# Patient Record
Sex: Male | Born: 1990 | Race: White | Hispanic: No | Marital: Single | Smoking: Never smoker
Health system: Southern US, Community
[De-identification: ages and names within clinical notes are randomized; demographics above are authoritative.]

---

## 2014-12-14 ENCOUNTER — Emergency Department (HOSPITAL_COMMUNITY)
Admission: EM | Admit: 2014-12-14 | Discharge: 2014-12-14 | Disposition: A | Discharge status: Home / Self Care | Payer: Worker's Compensation | Attending: Emergency Medicine | Admitting: Emergency Medicine

## 2014-12-14 ENCOUNTER — Emergency Department (HOSPITAL_COMMUNITY): Payer: Worker's Compensation

## 2014-12-14 ENCOUNTER — Encounter (HOSPITAL_COMMUNITY): Payer: Self-pay | Admitting: Emergency Medicine

## 2014-12-14 DIAGNOSIS — W230XXA Caught, crushed, jammed, or pinched between moving objects, initial encounter: Secondary | ICD-10-CM | POA: Diagnosis not present

## 2014-12-14 DIAGNOSIS — Y990 Civilian activity done for income or pay: Secondary | ICD-10-CM | POA: Diagnosis not present

## 2014-12-14 DIAGNOSIS — S62609B Fracture of unspecified phalanx of unspecified finger, initial encounter for open fracture: Secondary | ICD-10-CM

## 2014-12-14 DIAGNOSIS — S60132A Contusion of left middle finger with damage to nail, initial encounter: Secondary | ICD-10-CM | POA: Diagnosis not present

## 2014-12-14 DIAGNOSIS — S62623B Displaced fracture of medial phalanx of left middle finger, initial encounter for open fracture: Secondary | ICD-10-CM | POA: Insufficient documentation

## 2014-12-14 DIAGNOSIS — S6992XA Unspecified injury of left wrist, hand and finger(s), initial encounter: Secondary | ICD-10-CM | POA: Diagnosis present

## 2014-12-14 DIAGNOSIS — Y9389 Activity, other specified: Secondary | ICD-10-CM | POA: Insufficient documentation

## 2014-12-14 DIAGNOSIS — Y9289 Other specified places as the place of occurrence of the external cause: Secondary | ICD-10-CM | POA: Diagnosis not present

## 2014-12-14 MED ORDER — CEPHALEXIN 500 MG PO CAPS: 500.0000 mg | ORAL_CAPSULE | Freq: Four times a day (QID) | ORAL | Status: DC

## 2014-12-14 MED ORDER — HYDROCODONE-ACETAMINOPHEN 5-325 MG PO TABS: 1.0000 | ORAL_TABLET | Freq: Four times a day (QID) | ORAL | Status: DC | PRN

## 2014-12-14 MED ORDER — CEFAZOLIN SODIUM 1 G IJ SOLR
1.0000 g | Freq: Once | INTRAMUSCULAR | Status: AC
  Administered 2014-12-14: 1 g via INTRAMUSCULAR
  Filled 2014-12-14: qty 10

## 2014-12-14 MED ORDER — BUPIVACAINE HCL (PF) 0.5 % IJ SOLN
20.0000 mL | Freq: Once | INTRAMUSCULAR | Status: AC
  Administered 2014-12-14: 20 mL
  Filled 2014-12-14: qty 20

## 2014-12-14 MED ORDER — LIDOCAINE HCL (PF) 1 % IJ SOLN
5.0000 mL | Freq: Once | INTRAMUSCULAR | Status: AC
  Administered 2014-12-14: 5 mL
  Filled 2014-12-14: qty 5

## 2014-12-14 NOTE — ED Provider Notes (Signed)
CSN: [id number]     Arrival date & time 12/14/14  4098 History  This chart was scribed for non-physician practitioner, Junius Finner, PA-C working with Arby Barrette, MD by Greggory Stallion, ED scribe. This patient was seen in room TR08C/TR08C and the patient's care was started at 10:05 AM.    Chief Complaint  Patient presents with  . Finger Injury   The history is provided by the patient. No language interpreter was used.    HPI Comments: Alan Haynes is a 24 y.o. male who presents to the Emergency Department complaining of left middle finger injury that occurred around 6:45 AM today. Pt was trying to clean an industrial machine when his left finger got crushed in it. He reports sudden onset pain that he rates 4/10 with associated swelling and hematoma. Bending his finger worsens pain. His tetanus was updated today at his employee occupational health after injury. Pt is right hand dominant.   History reviewed. No pertinent past medical history. History reviewed. No pertinent past surgical history. No family history on file. History  Substance Use Topics  . Smoking status: Never Smoker   . Smokeless tobacco: Not on file  . Alcohol Use: Yes    Review of Systems  Musculoskeletal: Positive for joint swelling and arthralgias.  Skin: Positive for wound.  All other systems reviewed and are negative.  Allergies  Review of patient's allergies indicates no known allergies.  Home Medications   Prior to Admission medications   Medication Sig Start Date End Date Taking? Authorizing Provider  cephALEXin (KEFLEX) 500 MG capsule Take 1 capsule (500 mg total) by mouth 4 (four) times daily. 12/14/14   Junius Finner, PA-C  HYDROcodone-acetaminophen (NORCO/VICODIN) 5-325 MG per tablet Take 1-2 tablets by mouth every 6 (six) hours as needed for moderate pain or severe pain. 12/14/14   Junius Finner, PA-C   BP 119/69 mmHg  Pulse 72  Temp(Src) 98.2 F (36.8 C) (Oral)  Resp 16  Ht  (1.626 m)   Wt 165 lb (74.844 kg)  BMI 28.31 kg/m2  SpO2 98%   Physical Exam  Constitutional: He is oriented to person, place, and time. He appears well-developed and well-nourished.  HENT:  Head: Normocephalic and atraumatic.  Eyes: EOM are normal.  Neck: Normal range of motion.  Cardiovascular: Normal rate.   Cap refill less than 3 seconds.   Pulmonary/Chest: Effort normal.  Musculoskeletal: Normal range of motion.  Distal aspect of left middle finger with hematoma of skin and radial aspect of nailbed. Mild tenderness to DIP. Full ROM.   Neurological: He is alert and oriented to person, place, and time.  Sensation intact.  Skin: Skin is warm and dry.  Psychiatric: He has a normal mood and affect. His behavior is normal.  Nursing note and vitals reviewed.   ED Course  Procedures (including critical care time)  DIAGNOSTIC STUDIES: Oxygen Saturation is 98% on RA, normal by my interpretation.    COORDINATION OF CARE: 10:07 AM-Discussed treatment plan which includes xray with pt at bedside and pt agreed to plan.   10:54 AM-Brian Wynona Neat, PA-C saw and evaluated pt. He will removal nail and start on ancef in ED.   Labs Review Labs Reviewed - No data to display  Imaging Review Dg Finger Middle Left  12/14/2014   CLINICAL DATA:  Crush injury of the tip of the left middle finger this morning.  EXAM: LEFT MIDDLE FINGER 2+V  COMPARISON:  None.  FINDINGS: There is a comminuted fracture of the  tuft of the distal phalanx of the left middle finger. No angulation or displacement. The other bones of the finger are normal.  IMPRESSION: Slightly comminuted fracture of a portion of the tuft of the distal phalanx.   Electronically Signed   By: Francene BoyersJames  Maxwell M.D.   On: 12/14/2014 10:19     EKG Interpretation None      MDM   Final diagnoses:  Finger fracture, left, open, initial encounter    Pt is a 23yo male with crush injury to left middle finger, distal aspect, neurovascularly in tact. Plain  films: significant for slightly comminuted fracture of portion of tuft of distal phalanx.  Karie ChimeraBrian Buchanan, PA-C, hand surgery, evaluated and tx pt in ED. Pt given ancef 1g in ED. Discharged home with keflex and pain medication. Advised to f/u in Dr. Carlos LeveringGramig's office in 1 week for wound and fracture recheck. Return precautions provided. Pt verbalized understanding and agreement with tx plan.   I personally performed the services described in this documentation, which was scribed in my presence. The recorded information has been reviewed and is accurate.   Junius Finnerrin O'Malley, PA-C 12/14/14 1641  Arby BarretteMarcy Pfeiffer, MD 12/16/14 2006

## 2014-12-14 NOTE — Consult Note (Signed)
Reason for Consult: Left middle finger crush injury Referring Physician: Dr. Thalia Bloodgood Hinzman is an 24 y.o. male.  HPI: The patient is a pleasant 24 year old male, right-hand-dominant, who presented to the emergency room setting today for evaluation of his left middle finger. He is employed at [employer] and sustained an on-the-job injury. The patient was cleaning a packing machine, when the left middle finger became caught in the machine. He sustained a crush injury to the distal tip of the left middle finger. He was initially seen and evaluated by the emergency room staff. He was found to have a fracture about the left middle finger distal phalanx, this was extra-articular, and nondisplaced. He was noted to have at least 30% subungual hematoma present about the left middle finger. We were consulted in regards to the finger given the concerns for an open fracture presentation. Upon examination the patient was pleasant and in no acute distress. He had obvious swelling, ecchymosis and an evolving subungual hematoma of at least 30% of the well plate. Remaining hand was without injury. He denied any other injury to the upper extremity. Radiographs were reviewed and showed a nondisplaced fracture about the distal phalanx, extra-articular in nature.  History reviewed. No pertinent past medical history.  History reviewed. No pertinent past surgical history.  No family history on file.  Social History:  reports that he has never smoked. He does not have any smokeless tobacco history on file. He reports that he drinks alcohol. He reports that he does not use illicit drugs.  Allergies: No Known Allergies  Medications: None  No results found for this or any previous visit (from the past 48 hour(s)).  Dg Finger Middle Left  12/14/2014   CLINICAL DATA:  Crush injury of the tip of the left middle finger this morning.  EXAM: LEFT MIDDLE FINGER 2+V  COMPARISON:  None.  FINDINGS: There is a  comminuted fracture of the tuft of the distal phalanx of the left middle finger. No angulation or displacement. The other bones of the finger are normal.  IMPRESSION: Slightly comminuted fracture of a portion of the tuft of the distal phalanx.   Electronically Signed   By: Francene Boyers M.D.   On: 12/14/2014 10:19    Review of Systems  Constitutional: Negative.   HENT: Negative.   Eyes: Negative.   Respiratory: Negative.   Cardiovascular: Negative.   Gastrointestinal: Negative.   Musculoskeletal:       See history of present illness  Skin: Negative.   Neurological: Negative.   Endo/Heme/Allergies: Negative.    Blood pressure 137/72, temperature 98.2 F (36.8 C), temperature source Oral, resp. rate 18, height  (1.626 m), weight 74.844 kg (165 lb), SpO2 98 %. Physical Exam  Evaluation of the left middle finger: Notable swelling and ecchymosis about the distal tip is present. He has approximately 30% subungual hematoma about the nail plate with notable ecchymosis and a skin laceration about the eponychial fold. FDS, FDP,  terminal extensor tendon, and central slip appear to be intact. Main examination of the hand was without gross abnormality  Assessment/Plan: On-the-job injury Left middle finger crush injury with concerns for open fracture process about the distal phalanx I have discussed with the patient at length our concerns for an open fracture given the degree of subungual hematoma that present as well as radiographic findings of the fracture. We have discussed the pertinent physiology of the injury and have recommended to proceed with nail plate removal irrigation and  debridement as well as repair of the nail bed. The patient understands, questions were encouraged and answered. Verbal Consent was obtained, following this, digital block was implemented to the left middle finger utilizing 5 mL of Sensorcaine and 5 mL of Xylocaine without epinephrine. The patient tolerated this well and  there was no complications once appropriate anesthetic was obtained, the hand was prepped and draped in the usual sterile fashion. Tourniquet was placed about the base of the middle finger utilizing portions latex glove. Attention was then turned to the well plate at this juncture a freer elevator was used to lift the nailplate, and then this was removed utilizing hemostats. The nailbed was then evaluated and it was noted that there was a transverse laceration at the base of the nailbed extending transversely in nature. Exposed bone was present. Following this sharp excisional debridement of necrotic improving necrotic tissue was performed about the eponychial fold as well as the nailbed laceration. Copious irrigation was then employed into the depths of the wound with over a liter of saline.  The nailbed laceration was then repaired utilizing a 6-0 chromic. A series of simple sutures were placed without difficulty completing the nailbed repair. The tourniquet was then released, etc. refill was noted. Once again additional irrigation was performed to the distal tip. Following this Adaptic was placed underneath the eponychial fold, followed by soft dressings, followed by distal tip splint and soft dressings. The patient tolerated the procedure well there was no complications. Prior to leaving the emergency room setting he will receive 1 g of Ancef IV.'s thiswas discussed with the emergency room staff. In addition the patient will be given a prescription for Keflex 500 mg 1 by mouth 4 times daily for 10 days. Hydrocodone will be given for pain. We have discussed with the patient is a recommendations that he be out of work today and upon his return tomorrow to work he will be light work duties no use of the upper extremity, keep his dressings clean and dry, elevate the extremity. We have discussed with him all issues at length. We will need to see him in our office in one week for a wound check and additional  radiographs. All questions were encouraged and answered    Crespin Forstrom L 12/14/2014, 12:10 PM

## 2014-12-14 NOTE — ED Notes (Signed)
Pt offered work note but he declined. His employers in the room said he had to work tonight.

## 2014-12-14 NOTE — ED Notes (Signed)
Left middle finger crushed in machine at work this am. Nail bed with hematoma and blood oozing from side of nail. Pt received a DT at his employee health.

## 2015-07-08 ENCOUNTER — Ambulatory Visit: Payer: Self-pay | Admitting: Family Medicine

## 2016-04-18 ENCOUNTER — Ambulatory Visit (INDEPENDENT_AMBULATORY_CARE_PROVIDER_SITE_OTHER): Payer: 59 | Admitting: Family Medicine

## 2016-04-18 ENCOUNTER — Encounter: Payer: Self-pay | Admitting: Family Medicine

## 2016-04-18 VITALS — BP 118/84 | Temp 98.5°F | Ht 64.0 in | Wt 180.0 lb

## 2016-04-18 DIAGNOSIS — N342 Other urethritis: Secondary | ICD-10-CM | POA: Diagnosis not present

## 2016-04-18 DIAGNOSIS — R300 Dysuria: Secondary | ICD-10-CM

## 2016-04-18 LAB — POCT URINALYSIS DIPSTICK
Spec Grav, UA: >=1.03
pH, UA: 5

## 2016-04-18 MED ORDER — DOXYCYCLINE HYCLATE 100 MG PO TABS: 100.0000 mg | ORAL_TABLET | Freq: Two times a day (BID) | ORAL | Status: DC

## 2016-04-18 NOTE — Progress Notes (Signed)
   Subjective:    Patient ID: Alan Haynes, male    DOB: 1991/09/26, 25 y.o.   MRN: [id number]  Dysuria  This is a new problem. Episode onset: 2 days. Associated symptoms include frequency.    Redness and discomfort with some discharge  Pos dysuria  Pos   Sexually active and exposure  No hx of chlamydia or similar in the past    Review of Systems  Genitourinary: Positive for dysuria and frequency.   No fever or chills    Objective:   Physical Exam  Alert vital stable lungs clear heart rare rhythm distal penis slight discharge and irritation Chlamydia screen obtained      Assessment & Plan:  Impression urethritis probable Chlamydia discussed plan docs he 100 twice a day 10 days symptom care discussed safe sex discussed

## 2016-04-24 LAB — GC/CHLAMYDIA PROBE AMP
CHLAMYDIA, DNA PROBE: NEGATIVE
NEISSERIA GONORRHOEAE BY PCR: POSITIVE — AB

## 2016-04-24 LAB — PLEASE NOTE

## 2016-05-09 NOTE — Progress Notes (Signed)
Call parents have pt call us for report

## 2016-05-10 ENCOUNTER — Encounter: Payer: Self-pay | Admitting: Family Medicine

## 2016-05-11 ENCOUNTER — Other Ambulatory Visit: Payer: Self-pay

## 2016-05-11 MED ORDER — AZITHROMYCIN 250 MG PO TABS
ORAL_TABLET | ORAL | Status: DC
Start: 2016-05-11 — End: 2017-03-05

## 2016-05-14 ENCOUNTER — Ambulatory Visit (INDEPENDENT_AMBULATORY_CARE_PROVIDER_SITE_OTHER): Payer: 59

## 2016-05-14 DIAGNOSIS — A549 Gonococcal infection, unspecified: Secondary | ICD-10-CM | POA: Diagnosis not present

## 2016-05-14 MED ORDER — CEFTRIAXONE SODIUM 1 G IJ SOLR
250.0000 mg | Freq: Once | INTRAMUSCULAR | Status: AC
  Administered 2016-05-14: 250 mg via INTRAMUSCULAR

## 2016-06-16 IMAGING — CR DG FINGER MIDDLE 2+V*L*
3 series · 3 of 3 positions shown · non-contrast
Comparison: None.

CLINICAL DATA: Crush injury of the tip of the left middle finger
this morning.

EXAM:
LEFT MIDDLE FINGER 2+V

[finger ap]
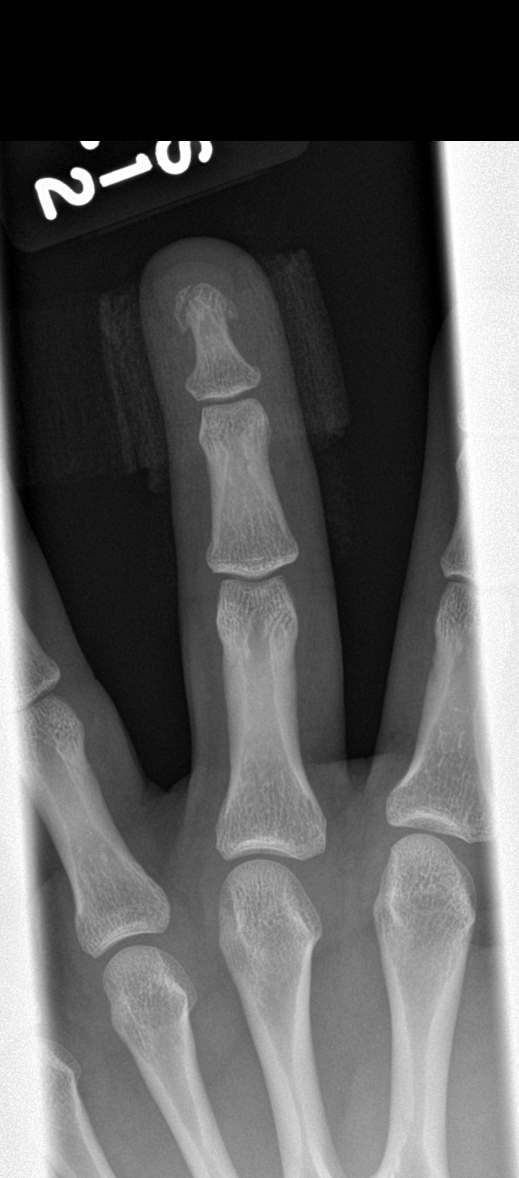

[finger obl]
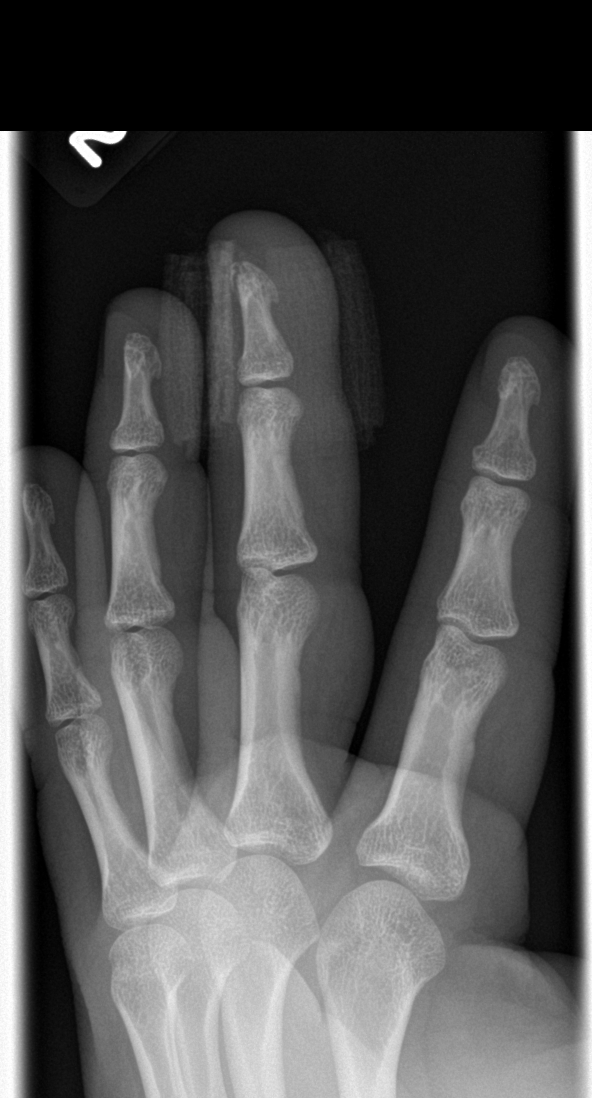

[finger lat]
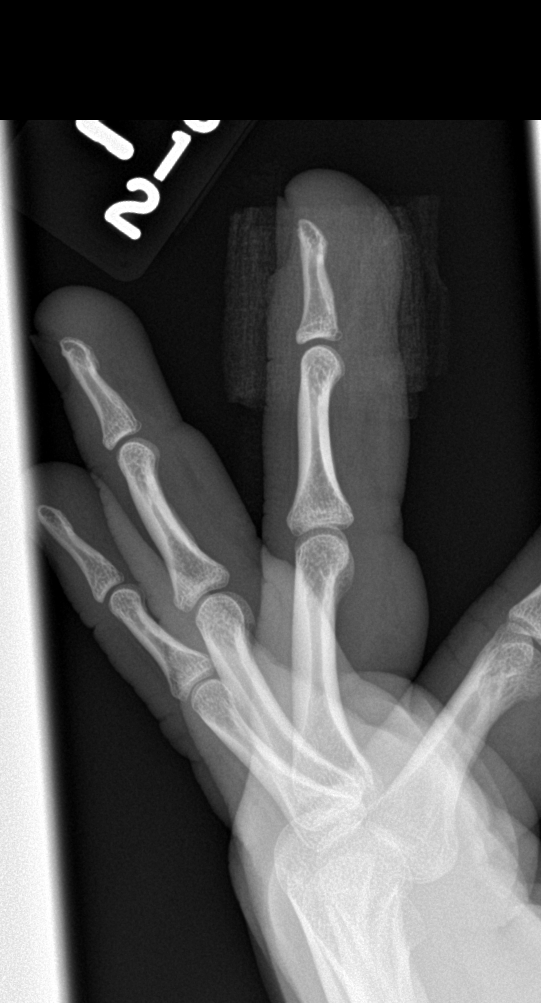

[3 of 3 positions shown; findings below may reference images not displayed]

FINDINGS: There is a comminuted fracture of the tuft of the distal phalanx of
the left middle finger. No angulation or displacement. The other
bones of the finger are normal.
IMPRESSION: Slightly comminuted fracture of a portion of the tuft of the distal
phalanx.

## 2016-07-24 ENCOUNTER — Encounter: Payer: 59 | Admitting: Family Medicine

## 2016-09-14 ENCOUNTER — Encounter: Payer: Self-pay | Admitting: Family Medicine

## 2016-09-14 ENCOUNTER — Ambulatory Visit (INDEPENDENT_AMBULATORY_CARE_PROVIDER_SITE_OTHER): Payer: 59 | Admitting: Family Medicine

## 2016-09-14 VITALS — BP 112/72 | Ht 64.0 in | Wt 166.0 lb

## 2016-09-14 DIAGNOSIS — S0003XA Contusion of scalp, initial encounter: Secondary | ICD-10-CM

## 2016-09-14 MED ORDER — HYDROCODONE-ACETAMINOPHEN 5-325 MG PO TABS: 1.0000 | ORAL_TABLET | Freq: Four times a day (QID) | ORAL | 0 refills | Status: DC | PRN
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
## 2016-10-18 ENCOUNTER — Ambulatory Visit: Payer: 59 | Admitting: Nurse Practitioner

## 2016-11-27 ENCOUNTER — Encounter: Payer: Self-pay | Admitting: Family Medicine

## 2017-03-05 ENCOUNTER — Ambulatory Visit (INDEPENDENT_AMBULATORY_CARE_PROVIDER_SITE_OTHER): Payer: Self-pay | Admitting: Family Medicine

## 2017-03-05 ENCOUNTER — Encounter: Payer: Self-pay | Admitting: Family Medicine

## 2017-03-05 VITALS — BP 132/88 | Ht 64.0 in | Wt 212.0 lb

## 2017-03-05 DIAGNOSIS — F411 Generalized anxiety disorder: Secondary | ICD-10-CM

## 2017-03-05 DIAGNOSIS — F4010 Social phobia, unspecified: Secondary | ICD-10-CM

## 2017-03-05 MED ORDER — CLONIDINE HCL 0.1 MG PO TABS: ORAL_TABLET | ORAL | 1 refills | Status: DC

## 2017-03-05 MED ORDER — CLONAZEPAM 1 MG PO TABS: ORAL_TABLET | ORAL | 1 refills | Status: DC

## 2017-03-05 MED ORDER — BUPROPION HCL ER (SR) 150 MG PO TB12: ORAL_TABLET | ORAL | 5 refills | Status: AC
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
## 2017-03-11 ENCOUNTER — Ambulatory Visit: Payer: 59 | Admitting: Family Medicine

## 2017-04-05 ENCOUNTER — Other Ambulatory Visit: Payer: Self-pay | Admitting: Family Medicine

## 2017-04-05 NOTE — Telephone Encounter (Signed)
Ok times one 

## 2017-04-08 ENCOUNTER — Telehealth: Payer: Self-pay | Admitting: *Deleted

## 2017-04-08 NOTE — Telephone Encounter (Signed)
Left message to return call. Dr Brett Canalessteve would like to see pt this week in office. Pharm called and pt was wanting refill on clonazepam. Just filled rx on 5/31 #24. Was suppose to be a 30 day supply. Pharm notified not to fill early.

## 2017-04-08 NOTE — Telephone Encounter (Signed)
Discussed with pt that he needs a follow up office visit this week. Transferred to the front to schedule office visit.

## 2017-04-11 ENCOUNTER — Ambulatory Visit: Payer: Self-pay | Admitting: Family Medicine

## 2017-05-20 ENCOUNTER — Other Ambulatory Visit: Payer: Self-pay | Admitting: Family Medicine

## 2017-05-20 NOTE — Telephone Encounter (Signed)
Last seen 03/05/17

## 2017-05-21 NOTE — Telephone Encounter (Signed)
Ok times one pt has f u next mo

## 2017-06-03 ENCOUNTER — Ambulatory Visit: Payer: Self-pay | Admitting: Family Medicine

## 2017-06-17 ENCOUNTER — Other Ambulatory Visit: Payer: Self-pay | Admitting: Family Medicine

## 2017-06-17 NOTE — Telephone Encounter (Signed)
Chart reviewed, due to be seen by now, refill times one, further rx's will need o v

## 2019-11-30 ENCOUNTER — Encounter: Payer: Self-pay | Admitting: Family Medicine
